# Patient Record
Sex: Female | Born: 1995 | Race: Black or African American | Hispanic: No | Marital: Single | Smoking: Never smoker
Health system: Southern US, Community
[De-identification: ages and names within clinical notes are randomized; demographics above are authoritative.]

## PROBLEM LIST (undated history)

## (undated) DIAGNOSIS — F410 Panic disorder [episodic paroxysmal anxiety] without agoraphobia: Secondary | ICD-10-CM

## (undated) DIAGNOSIS — F32A Depression, unspecified: Secondary | ICD-10-CM

## (undated) DIAGNOSIS — J45909 Unspecified asthma, uncomplicated: Secondary | ICD-10-CM

## (undated) DIAGNOSIS — F419 Anxiety disorder, unspecified: Secondary | ICD-10-CM

## (undated) DIAGNOSIS — F329 Major depressive disorder, single episode, unspecified: Secondary | ICD-10-CM

## (undated) HISTORY — PX: HERNIA REPAIR: SHX51

---

## 2016-02-06 ENCOUNTER — Encounter (HOSPITAL_COMMUNITY): Payer: Self-pay | Admitting: Emergency Medicine

## 2016-02-06 ENCOUNTER — Emergency Department (HOSPITAL_COMMUNITY): Payer: 59

## 2016-02-06 ENCOUNTER — Emergency Department (HOSPITAL_COMMUNITY)
Admission: EM | Admit: 2016-02-06 | Discharge: 2016-02-06 | Disposition: A | Discharge status: Home / Self Care | Payer: 59 | Attending: Emergency Medicine | Admitting: Emergency Medicine

## 2016-02-06 DIAGNOSIS — R079 Chest pain, unspecified: Secondary | ICD-10-CM | POA: Diagnosis present

## 2016-02-06 DIAGNOSIS — Z8659 Personal history of other mental and behavioral disorders: Secondary | ICD-10-CM | POA: Insufficient documentation

## 2016-02-06 DIAGNOSIS — R0789 Other chest pain: Secondary | ICD-10-CM | POA: Insufficient documentation

## 2016-02-06 DIAGNOSIS — J45909 Unspecified asthma, uncomplicated: Secondary | ICD-10-CM | POA: Diagnosis not present

## 2016-02-06 DIAGNOSIS — R42 Dizziness and giddiness: Secondary | ICD-10-CM | POA: Insufficient documentation

## 2016-02-06 DIAGNOSIS — Z3202 Encounter for pregnancy test, result negative: Secondary | ICD-10-CM | POA: Insufficient documentation

## 2016-02-06 DIAGNOSIS — R0602 Shortness of breath: Secondary | ICD-10-CM | POA: Diagnosis not present

## 2016-02-06 HISTORY — DX: Depression, unspecified: F32.A

## 2016-02-06 HISTORY — DX: Major depressive disorder, single episode, unspecified: F32.9

## 2016-02-06 HISTORY — DX: Panic disorder (episodic paroxysmal anxiety): F41.0

## 2016-02-06 HISTORY — DX: Unspecified asthma, uncomplicated: J45.909

## 2016-02-06 HISTORY — DX: Anxiety disorder, unspecified: F41.9

## 2016-02-06 LAB — CBC WITH DIFFERENTIAL/PLATELET
BASOS PCT: 1 %
Basophils Absolute: 0 10*3/uL (ref 0.0–0.1)
EOS ABS: 0.1 10*3/uL (ref 0.0–0.7)
EOS PCT: 1 %
HCT: 40.6 % (ref 36.0–46.0)
HEMOGLOBIN: 13.6 g/dL (ref 12.0–15.0)
Lymphocytes Relative: 43 %
Lymphs Abs: 2 10*3/uL (ref 0.7–4.0)
MCH: 30.6 pg (ref 26.0–34.0)
MCHC: 33.5 g/dL (ref 30.0–36.0)
MCV: 91.4 fL (ref 78.0–100.0)
MONOS PCT: 5 %
Monocytes Absolute: 0.2 10*3/uL (ref 0.1–1.0)
NEUTROS PCT: 50 %
Neutro Abs: 2.4 10*3/uL (ref 1.7–7.7)
Platelets: 246 10*3/uL (ref 150–400)
RBC: 4.44 MIL/uL (ref 3.87–5.11)
RDW: 14 % (ref 11.5–15.5)
WBC: 4.7 10*3/uL (ref 4.0–10.5)

## 2016-02-06 LAB — URINALYSIS, ROUTINE W REFLEX MICROSCOPIC
Bilirubin Urine: NEGATIVE
Glucose, UA: NEGATIVE mg/dL
Hgb urine dipstick: NEGATIVE
Ketones, ur: NEGATIVE mg/dL
Leukocytes, UA: NEGATIVE
NITRITE: NEGATIVE
Protein, ur: NEGATIVE mg/dL
SPECIFIC GRAVITY, URINE: 1.013 (ref 1.005–1.030)
pH: 6.5 (ref 5.0–8.0)

## 2016-02-06 LAB — BASIC METABOLIC PANEL
Anion gap: 6 (ref 5–15)
BUN: 10 mg/dL (ref 6–20)
CALCIUM: 9.1 mg/dL (ref 8.9–10.3)
CO2: 24 mmol/L (ref 22–32)
CREATININE: 0.88 mg/dL (ref 0.44–1.00)
Chloride: 109 mmol/L (ref 101–111)
GFR calc non Af Amer: >60 mL/min (ref >60)
Glucose, Bld: 98 mg/dL (ref 65–99)
Potassium: 4.3 mmol/L (ref 3.5–5.1)
SODIUM: 139 mmol/L (ref 135–145)

## 2016-02-06 LAB — TROPONIN I

## 2016-02-06 LAB — D-DIMER, QUANTITATIVE: D-Dimer, Quant: <0.27 ug/mL-FEU (ref 0.00–0.50)

## 2016-02-06 LAB — PREGNANCY, URINE: Preg Test, Ur: NEGATIVE

## 2016-02-06 MED ORDER — ACETAMINOPHEN 500 MG PO TABS
1000.0000 mg | ORAL_TABLET | Freq: Once | ORAL | Status: AC
  Administered 2016-02-06: 1000 mg via ORAL
  Filled 2016-02-06: qty 2

## 2016-02-06 MED ORDER — METHOCARBAMOL 500 MG PO TABS: 1000.0000 mg | ORAL_TABLET | Freq: Four times a day (QID) | ORAL | Status: AC | PRN

## 2016-02-06 NOTE — ED Notes (Signed)
Central tight chest pain starting last Thursday, states she did have a panic attack around then. C/o chest pain, SOB, and dizziness at this time, states the symptoms don't usually last over the course of days. Denies using birth control

## 2016-02-06 NOTE — ED Notes (Signed)
Family at bedside. 

## 2016-02-06 NOTE — ED Notes (Signed)
MD at bedside. 

## 2016-02-06 NOTE — Progress Notes (Signed)
[facility] ED CM noted pt with coverage but no pcp listed [facility] ED CM spoke with pt on how to obtain an in network pcp with insurance coverage via the customer service number or web site  Cm reviewed ED level of care for crisis/emergent services and community pcp level of care to manage continuous or chronic medical concerns.  The pt voiced understanding CM encouraged pt and discussed pt's responsibility to verify with pt's insurance carrier that any recommended medical provider offered by any emergency room or a hospital provider is within the carrier's network. The pt voiced understanding   

## 2016-02-06 NOTE — Discharge Instructions (Signed)
Take the prescription as directed. Also take over the counter tylenol, as directed on packaging, as needed for discomfort. Apply moist heat or ice to the area(s) of discomfort, for 15 minutes at a time, several times per day for the next few days.  Do not fall asleep on a heating or ice pack.  Call your regular medical doctor today to schedule a follow up appointment within the next 2 days. Call the mental health resources given to you todday regarding your anxiety.  Return to the Emergency Department immediately if worsening.

## 2016-02-06 NOTE — ED Provider Notes (Signed)
CSN: [id number]     Arrival date & time 02/06/16  [id number] History   First MD Initiated Contact with Patient 02/06/16 [phone]     Chief Complaint  Patient presents with  . Chest Pain      HPI  Pt was seen at 0945.  Per pt, c/o gradual onset and persistence of constant left sided chest wall "pain" that began 3 days ago. Has been associated with SOB and "dizziness." States her symptoms began "when I had a panic attack." Symptoms have been constant and unchanged since onset. States she has had these symptoms previously with a panic attack but "they don't usually last this long." Denies palpitations, no cough, no abd pain, no N/V/D, no fevers, no rash, no calf/LE pain or unilateral swelling.    Past Medical History  Diagnosis Date  . Asthma   . Anxiety   . Depressed   . Panic attack    Past Surgical History  Procedure Laterality Date  . Hernia repair      Social History  Substance Use Topics  . Smoking status: None  . Smokeless tobacco: None  . Alcohol Use: None    Review of Systems ROS: Statement: All systems negative except as marked or noted in the HPI; Constitutional: Negative for fever and chills. ; ; Eyes: Negative for eye pain, redness and discharge. ; ; ENMT: Negative for ear pain, hoarseness, nasal congestion, sinus pressure and sore throat. ; ; Cardiovascular: +CP, SOB. Negative for palpitations, diaphoresis, and peripheral edema. ; ; Respiratory: Negative for cough, wheezing and stridor. ; ; Gastrointestinal: Negative for nausea, vomiting, diarrhea, abdominal pain, blood in stool, hematemesis, jaundice and rectal bleeding. . ; ; Genitourinary: Negative for dysuria, flank pain and hematuria. ; ; Musculoskeletal: Negative for back pain and neck pain. Negative for swelling and trauma.; ; Skin: Negative for pruritus, rash, abrasions, blisters, bruising and skin lesion.; ; Neuro: +"dizziness." Negative for headache and neck stiffness. Negative for weakness, altered level of consciousness  , altered mental status, extremity weakness, paresthesias, involuntary movement, seizure and syncope.      Allergies  Ibuprofen; Kiwi extract; Chicken allergy; Pork-derived products; and Strawberry flavor  Home Medications   Prior to Admission medications   Not on File   BP 110/73 mmHg  Pulse 107  Temp(Src) 97.8 F (36.6 C) (Oral)  Resp 14  SpO2 98% Physical Exam  0950: Physical examination:  Nursing notes reviewed; Vital signs and O2 SAT reviewed;  Constitutional: Well developed, Well nourished, Well hydrated, In no acute distress; Head:  Normocephalic, atraumatic; Eyes: EOMI, PERRL, No scleral icterus; ENMT: Mouth and pharynx normal, Mucous membranes moist; Neck: Supple, Full range of motion, No lymphadenopathy; Cardiovascular: Regular rate and rhythm, No murmur, rub, or gallop; Respiratory: Breath sounds clear & equal bilaterally, No rales, rhonchi, wheezes.  Speaking full sentences with ease, Normal respiratory effort/excursion; Chest: +left parasternal and anterior chest wall tender to palp. No rash, no deformity, no soft tissue crepitus. Movement normal; Abdomen: Soft, Nontender, Nondistended, Normal bowel sounds; Genitourinary: No CVA tenderness; Extremities: Pulses normal, No tenderness, No edema, No calf edema or asymmetry.; Neuro: AA&Ox3, Major CN grossly intact.  Speech clear. No gross focal motor or sensory deficits in extremities. Climbs on and off stretcher easily by herself. Gait steady.; Skin: Color normal, Warm, Dry.; Psych:  Anxious.     ED Course  Procedures (including critical care time) Labs Review  Imaging Review  I have personally reviewed and evaluated these images and lab results as part of  my medical decision-making.   EKG Interpretation   Date/Time:  Monday February 06 2016 09:29:47 EDT Ventricular Rate:  97 PR Interval:  159 QRS Duration: 83 QT Interval:  318 QTC Calculation: 404 R Axis:   77 Text Interpretation:  Sinus rhythm RSR' in V1 or V2, right  VCD or RVH No  old tracing to compare Confirmed by [facility]  MD, Nicholos Johns [phone]) on  02/06/2016 9:50:33 AM      MDM  MDM Reviewed: previous chart, nursing note and vitals Interpretation: labs, ECG and x-ray      Results for orders placed or performed during the hospital encounter of 02/06/16  Basic metabolic panel  Result Value Ref Range   Sodium 139 135 - 145 mmol/L   Potassium 4.3 3.5 - 5.1 mmol/L   Chloride 109 101 - 111 mmol/L   CO2 24 22 - 32 mmol/L   Glucose, Bld 98 65 - 99 mg/dL   BUN 10 6 - 20 mg/dL   Creatinine, Ser 6.04 0.44 - 1.00 mg/dL   Calcium 9.1 8.9 - 54.0 mg/dL   GFR calc non Af Amer >60 >60 mL/min   GFR calc Af Amer >60 >60 mL/min   Anion gap 6 5 - 15  CBC with Differential  Result Value Ref Range   WBC 4.7 4.0 - 10.5 K/uL   RBC 4.44 3.87 - 5.11 MIL/uL   Hemoglobin 13.6 12.0 - 15.0 g/dL   HCT 98.1 19.1 - 47.8 %   MCV 91.4 78.0 - 100.0 fL   MCH 30.6 26.0 - 34.0 pg   MCHC 33.5 30.0 - 36.0 g/dL   RDW 29.5 62.1 - 30.8 %   Platelets 246 150 - 400 K/uL   Neutrophils Relative % 50 %   Neutro Abs 2.4 1.7 - 7.7 K/uL   Lymphocytes Relative 43 %   Lymphs Abs 2.0 0.7 - 4.0 K/uL   Monocytes Relative 5 %   Monocytes Absolute 0.2 0.1 - 1.0 K/uL   Eosinophils Relative 1 %   Eosinophils Absolute 0.1 0.0 - 0.7 K/uL   Basophils Relative 1 %   Basophils Absolute 0.0 0.0 - 0.1 K/uL  D-dimer, quantitative  Result Value Ref Range   D-Dimer, Quant <0.27 0.00 - 0.50 ug/mL-FEU  Pregnancy, urine  Result Value Ref Range   Preg Test, Ur NEGATIVE NEGATIVE  Urinalysis, Routine w reflex microscopic  Result Value Ref Range   Color, Urine YELLOW YELLOW   APPearance CLEAR CLEAR   Specific Gravity, Urine 1.013 1.005 - 1.030   pH 6.5 5.0 - 8.0   Glucose, UA NEGATIVE NEGATIVE mg/dL   Hgb urine dipstick NEGATIVE NEGATIVE   Bilirubin Urine NEGATIVE NEGATIVE   Ketones, ur NEGATIVE NEGATIVE mg/dL   Protein, ur NEGATIVE NEGATIVE mg/dL   Nitrite NEGATIVE NEGATIVE   Leukocytes,  UA NEGATIVE NEGATIVE  Troponin I  Result Value Ref Range   Troponin I <0.03 <0.031 ng/mL   Dg Chest 2 View 02/06/2016  CLINICAL DATA:  Mid chest pain, shortness of breath worsening over the last few days EXAM: CHEST  2 VIEW COMPARISON:  None. FINDINGS: The heart size and mediastinal contours are within normal limits. Both lungs are clear. The visualized skeletal structures are unremarkable. IMPRESSION: No active cardiopulmonary disease. Electronically Signed   By: Charlett Nose M.D.   On: 02/06/2016 11:26    1135:  Doubt PE as cause for symptoms with normal d-dimer and low risk [place].  Doubt ACS as cause for symptoms with normal troponin and  unchanged EKG from previous after 3 days of constant symptoms. Tx symptomatically, f/u PMD. Dx and testing d/w pt and family.  Questions answered.  Verb understanding, agreeable to d/c home with outpt f/u.    Samuel Jester, DO 02/10/16 0800

## 2016-03-27 ENCOUNTER — Emergency Department (HOSPITAL_COMMUNITY): Payer: 59

## 2016-03-27 ENCOUNTER — Emergency Department (HOSPITAL_COMMUNITY)
Admission: EM | Admit: 2016-03-27 | Discharge: 2016-03-27 | Disposition: A | Discharge status: Home / Self Care | Payer: 59 | Attending: Emergency Medicine | Admitting: Emergency Medicine

## 2016-03-27 ENCOUNTER — Encounter (HOSPITAL_COMMUNITY): Payer: Self-pay

## 2016-03-27 DIAGNOSIS — R55 Syncope and collapse: Secondary | ICD-10-CM | POA: Diagnosis not present

## 2016-03-27 DIAGNOSIS — R102 Pelvic and perineal pain: Secondary | ICD-10-CM | POA: Diagnosis not present

## 2016-03-27 DIAGNOSIS — J45909 Unspecified asthma, uncomplicated: Secondary | ICD-10-CM | POA: Insufficient documentation

## 2016-03-27 DIAGNOSIS — R1032 Left lower quadrant pain: Secondary | ICD-10-CM

## 2016-03-27 DIAGNOSIS — Z79899 Other long term (current) drug therapy: Secondary | ICD-10-CM | POA: Insufficient documentation

## 2016-03-27 LAB — URINALYSIS, ROUTINE W REFLEX MICROSCOPIC
Bilirubin Urine: NEGATIVE
Glucose, UA: NEGATIVE mg/dL
HGB URINE DIPSTICK: NEGATIVE
KETONES UR: NEGATIVE mg/dL
LEUKOCYTES UA: NEGATIVE
Nitrite: NEGATIVE
PROTEIN: NEGATIVE mg/dL
Specific Gravity, Urine: 1.028 (ref 1.005–1.030)
pH: 6.5 (ref 5.0–8.0)

## 2016-03-27 LAB — BASIC METABOLIC PANEL
Anion gap: 7 (ref 5–15)
BUN: 11 mg/dL (ref 6–20)
CALCIUM: 9.2 mg/dL (ref 8.9–10.3)
CO2: 22 mmol/L (ref 22–32)
CREATININE: 0.64 mg/dL (ref 0.44–1.00)
Chloride: 107 mmol/L (ref 101–111)
GFR calc non Af Amer: >60 mL/min (ref >60)
Glucose, Bld: 94 mg/dL (ref 65–99)
Potassium: 3.9 mmol/L (ref 3.5–5.1)
SODIUM: 136 mmol/L (ref 135–145)

## 2016-03-27 LAB — CBC
HCT: 41.3 % (ref 36.0–46.0)
Hemoglobin: 13.9 g/dL (ref 12.0–15.0)
MCH: 30.4 pg (ref 26.0–34.0)
MCHC: 33.7 g/dL (ref 30.0–36.0)
MCV: 90.4 fL (ref 78.0–100.0)
PLATELETS: 290 10*3/uL (ref 150–400)
RBC: 4.57 MIL/uL (ref 3.87–5.11)
RDW: 13.2 % (ref 11.5–15.5)
WBC: 6.6 10*3/uL (ref 4.0–10.5)

## 2016-03-27 LAB — HCG, QUANTITATIVE, PREGNANCY: hCG, Beta Chain, Quant, S: <1 m[IU]/mL (ref <5)

## 2016-03-27 LAB — WET PREP, GENITAL
Clue Cells Wet Prep HPF POC: NONE SEEN
SPERM: NONE SEEN
TRICH WET PREP: NONE SEEN
Yeast Wet Prep HPF POC: NONE SEEN

## 2016-03-27 LAB — CBG MONITORING, ED: GLUCOSE-CAPILLARY: 79 mg/dL (ref 65–99)

## 2016-03-27 MED ORDER — TRAMADOL HCL 50 MG PO TABS: 100.0000 mg | ORAL_TABLET | Freq: Four times a day (QID) | ORAL | Status: AC | PRN

## 2016-03-27 MED ORDER — ONDANSETRON HCL 4 MG/2ML IJ SOLN
4.0000 mg | Freq: Once | INTRAMUSCULAR | Status: AC | PRN
  Administered 2016-03-27: 4 mg via INTRAVENOUS
  Filled 2016-03-27: qty 2

## 2016-03-27 MED ORDER — POLYETHYLENE GLYCOL 3350 17 G PO PACK: 17.0000 g | PACK | Freq: Every day | ORAL | Status: AC

## 2016-03-27 MED ORDER — NAPROXEN SODIUM 220 MG PO TABS: 220.0000 mg | ORAL_TABLET | Freq: Two times a day (BID) | ORAL | Status: AC

## 2016-03-27 MED ORDER — MORPHINE SULFATE (PF) 4 MG/ML IV SOLN
4.0000 mg | Freq: Once | INTRAVENOUS | Status: AC
  Administered 2016-03-27: 4 mg via INTRAVENOUS
  Filled 2016-03-27: qty 1

## 2016-03-27 NOTE — ED Notes (Signed)
Pelvic setup at bedside.

## 2016-03-27 NOTE — ED Notes (Signed)
Pt states dizziness, nausea, vomiting x few days.  No fever.  Abdominal pain with frequent urination.  Pt states she is passing out and passed out x 2 today.  1st fall was caught by friend.  Second hit, Pt states she hit her hip and shoulder but not her head.

## 2016-03-27 NOTE — Progress Notes (Signed)
Patient listed as having [employer] insurance without a pcp.  EDCM spoke to patient at bedside.  Patient reports she [facility] but is not sure if he is her pcp.  EDCM provided patient with list of pcps who accept patient's insurance within a 20 mile radius of patient's zip code.  [facility].  Patient thankful for resources.  No further EDCM needs at this time.

## 2016-03-27 NOTE — ED Notes (Signed)
MD at bedside for pelvic

## 2016-03-27 NOTE — ED Notes (Signed)
US at bedside

## 2016-03-27 NOTE — ED Notes (Signed)
Pt instructed to change for pelvic

## 2016-03-27 NOTE — Discharge Instructions (Signed)
Pelvic Pain, Female Female pelvic pain can be caused by many different things and start from a variety of places. Pelvic pain refers to pain that is located in the lower half of the abdomen and between your hips. The pain may occur over a short period of time (acute) or may be reoccurring (chronic). The cause of pelvic pain may be related to disorders affecting the female reproductive organs (gynecologic), but it may also be related to the bladder, kidney stones, an intestinal complication, or muscle or skeletal problems. Getting help right away for pelvic pain is important, especially if there has been severe, sharp, or a sudden onset of unusual pain. It is also important to get help right away because some types of pelvic pain can be life threatening.  CAUSES  Below are only some of the causes of pelvic pain. The causes of pelvic pain can be in one of several categories.   Gynecologic.  Pelvic inflammatory disease.  Sexually transmitted infection.  Ovarian cyst or a twisted ovarian ligament (ovarian torsion).  Uterine lining that grows outside the uterus (endometriosis).  Fibroids, cysts, or tumors.  Ovulation.  Pregnancy.  Pregnancy that occurs outside the uterus (ectopic pregnancy).  Miscarriage.  Labor.  Abruption of the placenta or ruptured uterus.  Infection.  Uterine infection (endometritis).  Bladder infection.  Diverticulitis.  Miscarriage related to a uterine infection (septic abortion).  Bladder.  Inflammation of the bladder (cystitis).  Kidney stone(s).  Gastrointestinal.  Constipation.  Diverticulitis.  Neurologic.  Trauma.  Feeling pelvic pain because of mental or emotional causes (psychosomatic).  Cancers of the bowel or pelvis. EVALUATION  Your caregiver will want to take a careful history of your concerns. This includes recent changes in your health, a careful gynecologic history of your periods (menses), and a sexual history. Obtaining  your family history and medical history is also important. Your caregiver may suggest a pelvic exam. A pelvic exam will help identify the location and severity of the pain. It also helps in the evaluation of which organ system may be involved. In order to identify the cause of the pelvic pain and be properly treated, your caregiver may order tests. These tests may include:   A pregnancy test.  Pelvic ultrasonography.  An X-ray exam of the abdomen.  A urinalysis or evaluation of vaginal discharge.  Blood tests. HOME CARE INSTRUCTIONS   Only take over-the-counter or prescription medicines for pain, discomfort, or fever as directed by your caregiver.   Rest as directed by your caregiver.   Eat a balanced diet.   Drink enough fluids to make your urine clear or pale yellow, or as directed.   Avoid sexual intercourse if it causes pain.   Apply warm or cold compresses to the lower abdomen depending on which one helps the pain.   Avoid stressful situations.   Keep a journal of your pelvic pain. Write down when it started, where the pain is located, and if there are things that seem to be associated with the pain, such as food or your menstrual cycle.  Follow up with your caregiver as directed.  SEEK MEDICAL CARE IF:  Your medicine does not help your pain.  You have abnormal vaginal discharge. SEEK IMMEDIATE MEDICAL CARE IF:   You have heavy bleeding from the vagina.   Your pelvic pain increases.   You feel light-headed or faint.   You have chills.   You have pain with urination or blood in your urine.   You have uncontrolled  diarrhea or vomiting.   You have a fever or persistent symptoms for more than 3 days.  You have a fever and your symptoms suddenly get worse.   You are being physically or sexually abused.   This information is not intended to replace advice given to you by your health care provider. Make sure you discuss any questions you have with  your health care provider.   Document Released: 09/11/2004 Document Revised: 07/06/2015 Document Reviewed: 02/04/2012 Elsevier Interactive Patient Education 2016 Elsevier Inc. Suspected Mittelschmerz Mittelschmerz is pain in the lower abdomen that is felt between periods.  The pain affects one side of the abdomen. The side that it affects may change from month to month.  The pain may be mild or severe.  The pain may last from minutes to hours. It does not last longer than 1-2 days.  The pain can happen with nausea and light vaginal bleeding. Mittelschmerz is common among women. It is caused by the growth and release of an egg from an ovary, and it is a natural part of the ovulation cycle. It often happens about two weeks after a woman's period ends. HOME CARE INSTRUCTIONS Pay attention to any changes in your condition. Take these actions to help with your pain:  Try soaking in a hot bath.  Take over-the-counter and prescription medicines only as told by your health care provider.  Keep all follow-up visits as told by your health care provider. This is important. SEEK MEDICAL CARE IF:  You have very bad pain most months.  You have abdominal pain that lasts longer than 24 hours.  Your pain medicine is not helping.  You have a fever.  You have nausea or vomiting that will not go away.  You miss your period.  You have vaginal bleeding between your periods that is heavier than spotting.   This information is not intended to replace advice given to you by your health care provider. Make sure you discuss any questions you have with your health care provider.   Document Released: 10/05/2002 Document Revised: 07/06/2015 Document Reviewed: 01/10/2015 Elsevier Interactive Patient Education [employer]2016 Elsevier Inc.

## 2016-03-27 NOTE — ED Provider Notes (Signed)
CSN: [id number]     Arrival date & time 03/27/16  1540 History   First MD Initiated Contact with Patient 03/27/16 1707     Chief Complaint  Patient presents with  . Abdominal Pain  . Dizziness     (Consider location/radiation/quality/duration/timing/severity/associated sxs/prior Treatment) HPI The patient has lower abdominal pain that started yesterday. She reports that it's low and somewhat more to the left. Pain is sharp and worse with movements. No abnormal vaginal bleeding. The patient's last menstrual cycle approximately 2 weeks ago. No vaginal discharge if she considers abnormal. No fevers or vomiting. There has been slight nausea. Patient reports she's had constipation. She reports urinary frequency but no pain burning or urgency. Today patient experienced 2 episodes of lightheadedness and near-syncope with standing. The patient had a companion with her and did not fall or sustain injury. One episode occurred with standing and walking in her home and another episode with standing after sitting in the car. No associated headache or chest pain. Past Medical History  Diagnosis Date  . Asthma   . Anxiety   . Depressed   . Panic attack    Past Surgical History  Procedure Laterality Date  . Hernia repair     History reviewed. No pertinent family history. Social History  Substance Use Topics  . Smoking status: Never Smoker   . Smokeless tobacco: None  . Alcohol Use: No   OB History    No data available     Review of Systems 10 Systems reviewed and are negative for acute change except as noted in the HPI.    Allergies  Ibuprofen; Kiwi extract; Apple; Chicken allergy; Pork-derived products; and Strawberry flavor  Home Medications   Prior to Admission medications   Medication Sig Start Date End Date Taking? Authorizing Provider  acetaminophen (TYLENOL) 500 MG tablet Take 500 mg by mouth every 6 (six) hours as needed for moderate pain or headache.   Yes Historical Provider,  MD  cetirizine (ZYRTEC) 10 MG tablet Take 10 mg by mouth daily as needed for allergies.   Yes Historical Provider, MD  LATUDA 40 MG TABS tablet Take 40 mg by mouth at bedtime. 01/02/16  Yes Historical Provider, MD  PROAIR HFA 108 (90 Base) MCG/ACT inhaler Inhale 1-2 puffs into the lungs every 6 (six) hours as needed for wheezing or shortness of breath.  02/07/16  Yes Historical Provider, MD  methocarbamol (ROBAXIN) 500 MG tablet Take 2 tablets (1,000 mg total) by mouth 4 (four) times daily as needed for muscle spasms (muscle spasm/pain). Patient not taking: Reported on 03/27/2016 02/06/16   Samuel JesterKathleen McManus, DO  naproxen sodium (ALEVE) 220 MG tablet Take 1 tablet (220 mg total) by mouth 2 (two) times daily with a meal. 03/27/16   Arby BarretteMarcy Maks Cavallero, MD  polyethylene glycol (MIRALAX / GLYCOLAX) packet Take 17 g by mouth daily. 03/27/16   Arby BarretteMarcy Lexis Potenza, MD  traMADol (ULTRAM) 50 MG tablet Take 2 tablets (100 mg total) by mouth every 6 (six) hours as needed for moderate pain. 1-2 tablets every 6 hours as needed for pain 03/27/16   Arby BarretteMarcy Keionte Swicegood, MD   BP 110/75 mmHg  Pulse 92  Temp(Src) 98.2 F (36.8 C) (Oral)  Resp 16  SpO2 97%  LMP 03/13/2016 Physical Exam  Constitutional: She is oriented to person, place, and time. She appears well-developed and well-nourished.  HENT:  Head: Normocephalic and atraumatic.  Eyes: EOM are normal. Pupils are equal, round, and reactive to light.  Neck: Neck supple.  Cardiovascular: Normal rate, regular rhythm, normal heart sounds and intact distal pulses.   Pulmonary/Chest: Effort normal and breath sounds normal.  Abdominal: Soft. Bowel sounds are normal. She exhibits no distension. There is tenderness.  Pain to palpation suprapubic area and left lower quadrant. No guarding or rebound. No palpable mass. Upper and mid abdomen is nontender to palpation. No CVA tenderness.  Genitourinary:  Normal external female genitalia. Speculum examination cervix normal without discharge  or friability. Bimanual mild diffuse tenderness of uterus and bilateral adnexa.  Musculoskeletal: Normal range of motion. She exhibits no edema or tenderness.  Neurological: She is alert and oriented to person, place, and time. She has normal strength. Coordination normal. GCS eye subscore is 4. GCS verbal subscore is 5. GCS motor subscore is 6.  Skin: Skin is warm, dry and intact.  Psychiatric: She has a normal mood and affect.    ED Course  Procedures (including critical care time) Labs Review Labs Reviewed  WET PREP, GENITAL  BASIC METABOLIC PANEL  CBC  URINALYSIS, ROUTINE W REFLEX MICROSCOPIC (NOT AT [facility])  HCG, QUANTITATIVE, PREGNANCY  RPR  HIV ANTIBODY (ROUTINE TESTING)  CBG MONITORING, ED  GC/CHLAMYDIA PROBE AMP ([place]) NOT AT [facility]    Imaging Review US Transvaginal Non-ob  03/27/2016  CLINICAL DATA:  Left lower quadrant pain x2 days EXAM: TRANSABDOMINAL AND TRANSVAGINAL ULTRASOUND OF PELVIS TECHNIQUE: Both transabdominal and transvaginal ultrasound examinations of the pelvis were performed. Transabdominal technique was performed for global imaging of the pelvis including uterus, ovaries, adnexal regions, and pelvic cul-de-sac. It was necessary to proceed with endovaginal exam following the transabdominal exam to visualize the endometrium. COMPARISON:  None FINDINGS: Uterus Measurements: 6.9 x 3.4 x 3.9 cm. No fibroids or other mass visualized. Endometrium Thickness: 11 mm.  No focal abnormality visualized. Right ovary Measurements: 2.5 x 2.1 x 2.7 cm. Small peripheral follicles. Left ovary Measurements: 2.0 x 1.4 x 2.1 cm. Multiple small follicles. Other findings Small volume pelvic ascites. IMPRESSION: Negative pelvic ultrasound. Electronically Signed   By: Charline Bills M.D.   On: 03/27/2016 20:58   US Pelvis Complete  03/27/2016  CLINICAL DATA:  Left lower quadrant pain x2 days EXAM: TRANSABDOMINAL AND TRANSVAGINAL ULTRASOUND OF PELVIS TECHNIQUE: Both transabdominal and  transvaginal ultrasound examinations of the pelvis were performed. Transabdominal technique was performed for global imaging of the pelvis including uterus, ovaries, adnexal regions, and pelvic cul-de-sac. It was necessary to proceed with endovaginal exam following the transabdominal exam to visualize the endometrium. COMPARISON:  None FINDINGS: Uterus Measurements: 6.9 x 3.4 x 3.9 cm. No fibroids or other mass visualized. Endometrium Thickness: 11 mm.  No focal abnormality visualized. Right ovary Measurements: 2.5 x 2.1 x 2.7 cm. Small peripheral follicles. Left ovary Measurements: 2.0 x 1.4 x 2.1 cm. Multiple small follicles. Other findings Small volume pelvic ascites. IMPRESSION: Negative pelvic ultrasound. Electronically Signed   By: Charline Bills M.D.   On: 03/27/2016 20:58   I have personally reviewed and evaluated these images and lab results as part of my medical decision-making.   EKG Interpretation   Date/Time:  Tuesday Mar 27 2016 16:09:10 EDT Ventricular Rate:  100 PR Interval:  145 QRS Duration: 78 QT Interval:  313 QTC Calculation: 404 R Axis:   80 Text Interpretation:  Sinus tachycardia Right atrial enlargement Since  last tracing rate faster Confirmed by Effie Shy  MD, Mechele Collin ([id number]) on  03/27/2016 4:13:54 PM      MDM   Final diagnoses:  Pelvic pain in female  Near syncope   Patient had pelvic pain for approximately 2 days. Diagnostic workup is within normal limits. Ultrasound shows no pelvic anomaly and bimanual examination is not suggestive of PID. Do suspect mid menstrual cycle pain. She has nonsurgical abdomen and I doubt other surgical etiology such as appendicitis or other type of colitis. Pregnancy, torsion and UTI are ruled out. At this point feel the patient is safe for ongoing treatment for pain with ibuprofen and tramadol as needed. He had 2 episodes of near syncope with lightheadedness today. This sounds vasovagal and somewhat orthostatic in nature. Symptoms  occurred with transitioning from a sitting to standing or walking. Patient does not appear to have cardiopulmonary risk factors or associated symptoms that would be suggestive of significant cardiopulmonary etiology.    Arby Barrette, MD 03/27/16 2228

## 2016-03-28 LAB — RPR: RPR: NONREACTIVE

## 2016-03-28 LAB — GC/CHLAMYDIA PROBE AMP (~~LOC~~) NOT AT ARMC
CHLAMYDIA, DNA PROBE: NEGATIVE
NEISSERIA GONORRHEA: NEGATIVE

## 2016-03-28 LAB — HIV ANTIBODY (ROUTINE TESTING W REFLEX): HIV SCREEN 4TH GENERATION: NONREACTIVE

## 2017-09-26 IMAGING — US US TRANSVAGINAL NON-OB
1 series · 14 of 25 positions shown · non-contrast
Comparison: None

CLINICAL DATA: Left lower quadrant pain x2 days



[Series 1: us transvaginal non-ob · 0.21mm/px · 107 acquisitions, 14 frames shown]
[im 1/107]
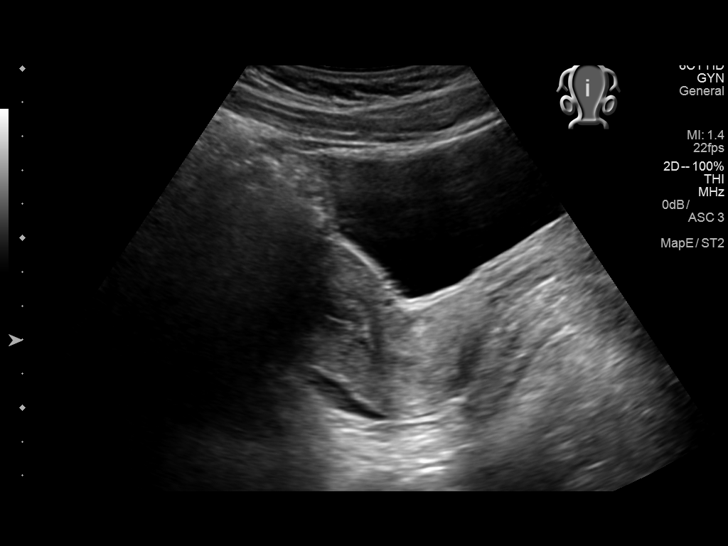
[im 9/107]
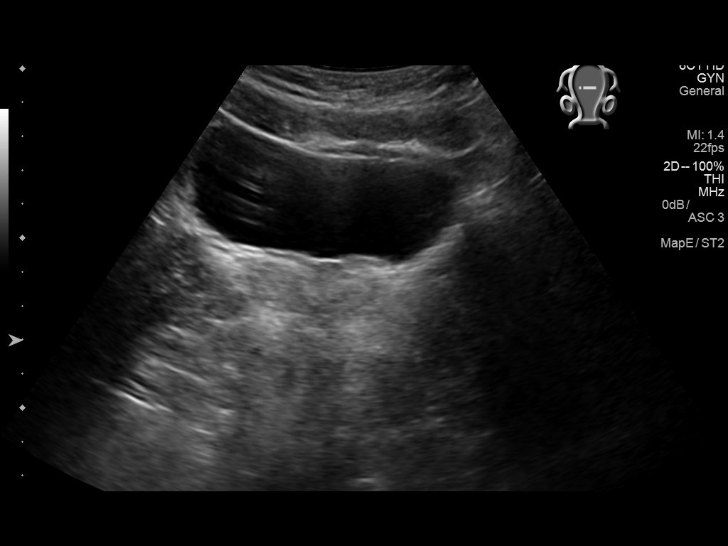
[im 18/107]
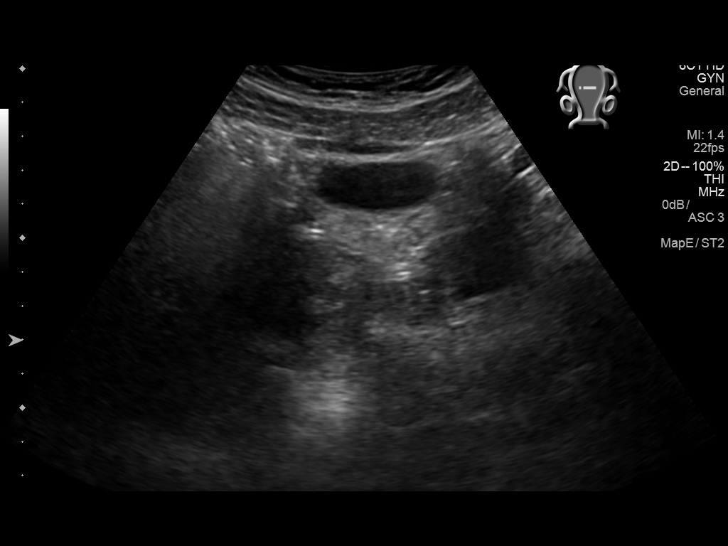
[im 27/107]
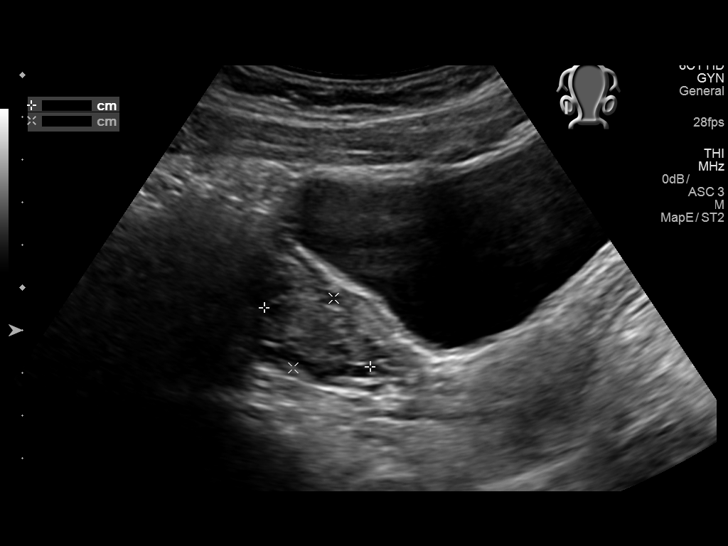
[im 36/107]
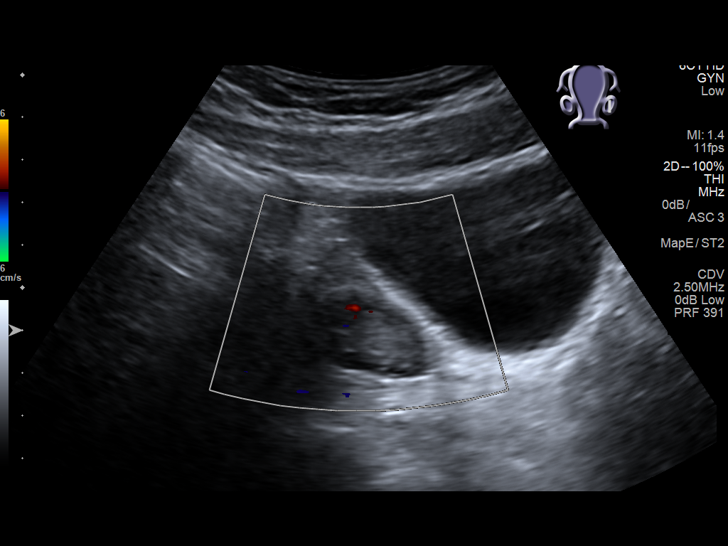
[im 40/107]
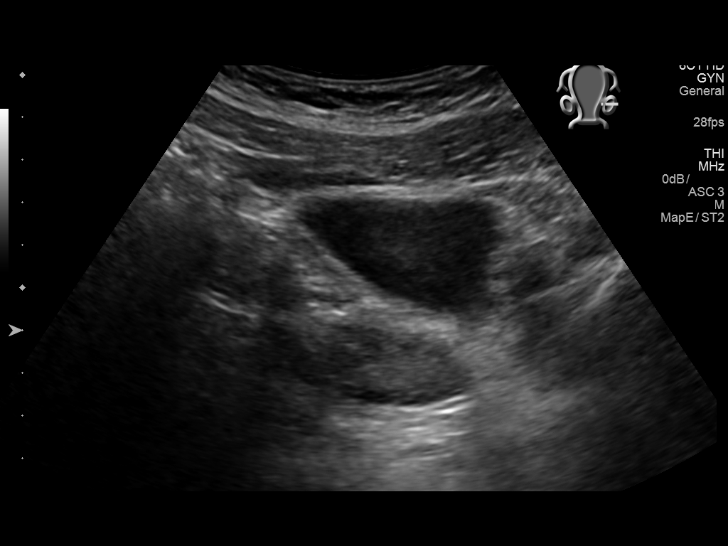
[im 49/107]
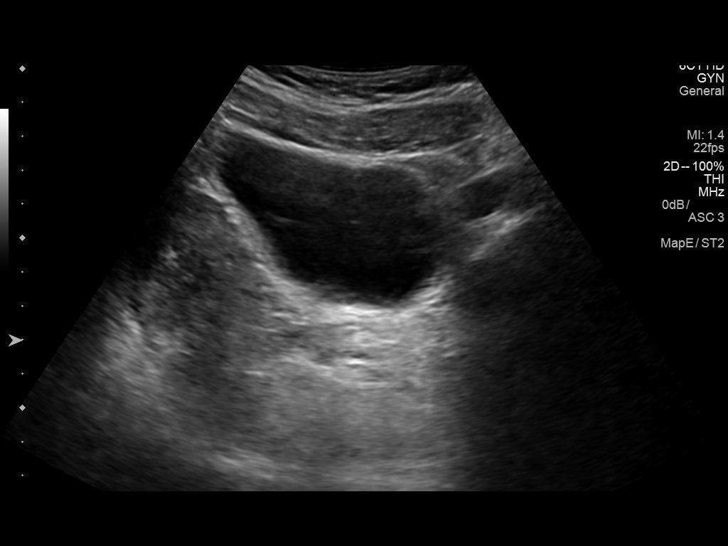
[im 58/107]
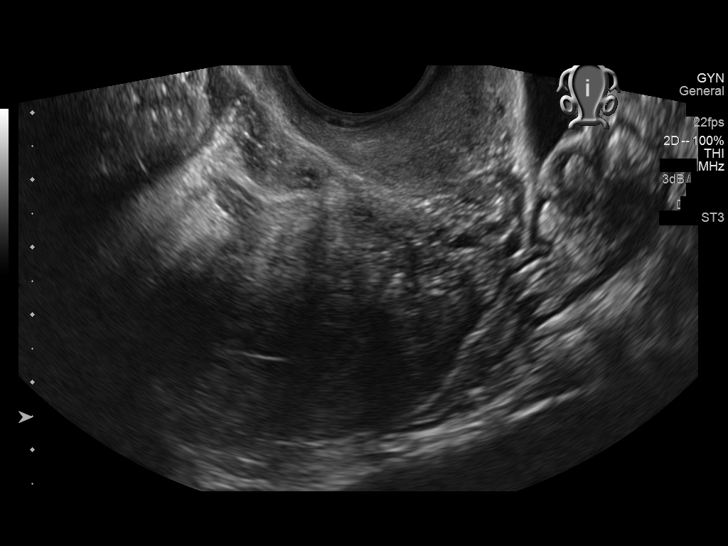
[im 67/107]
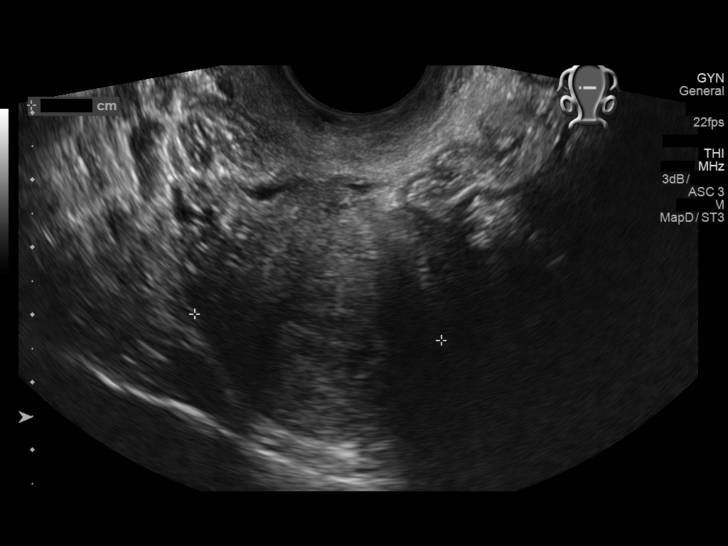
[im 71/107]
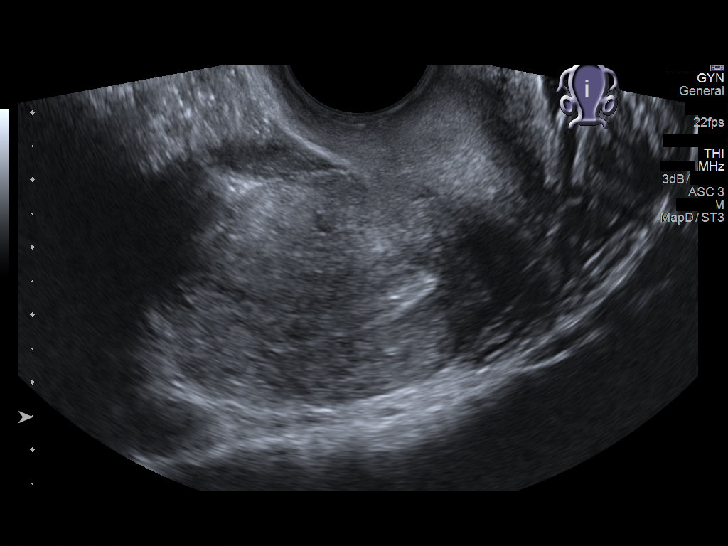
[im 80/107]
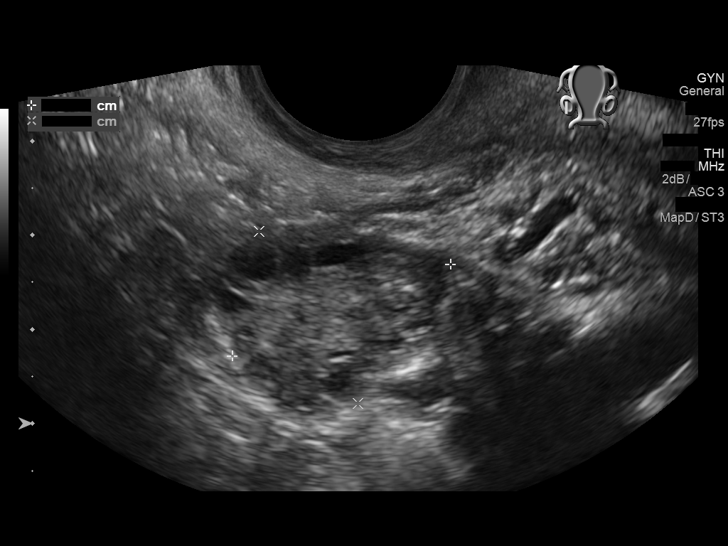
[im 89/107]
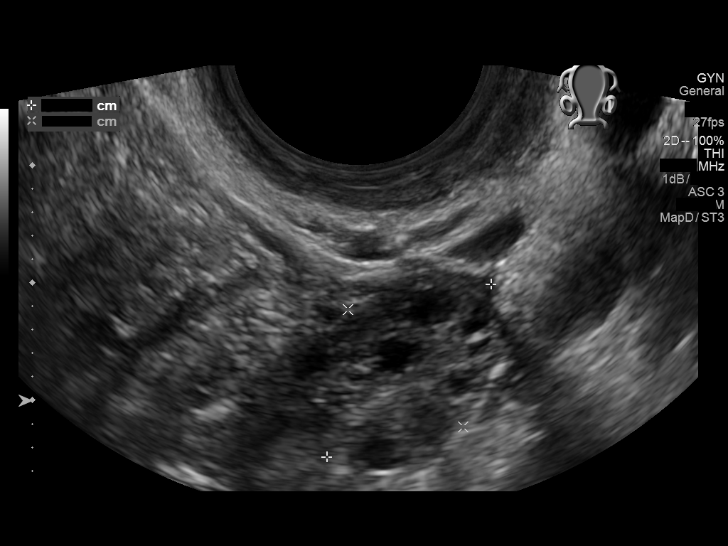
[im 98/107]
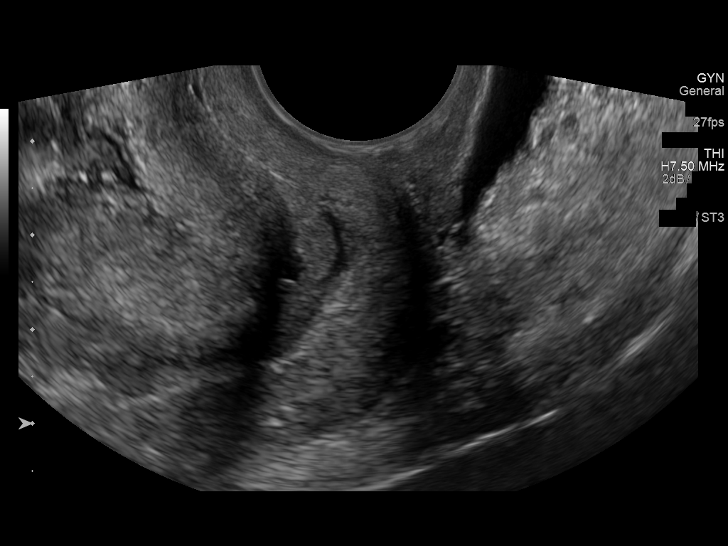
[im 107/107]
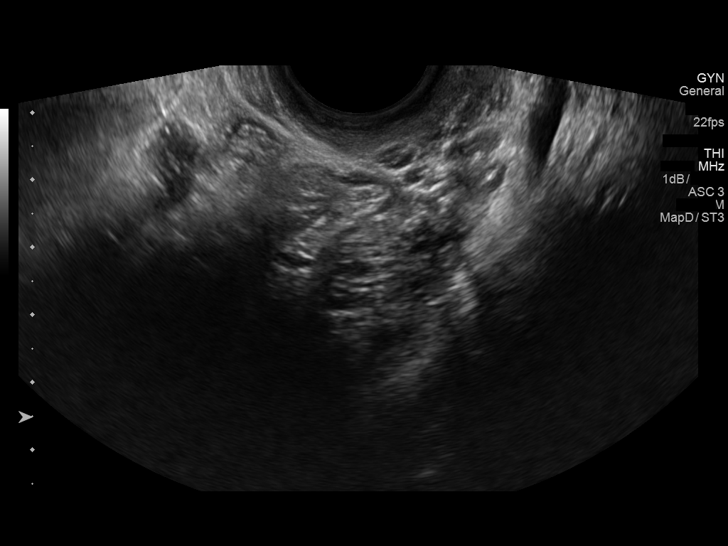

[14 of 25 positions shown; findings below may reference images not displayed]

FINDINGS: Uterus

Measurements: 6.9 x 3.4 x 3.9 cm. No fibroids or other mass
visualized.

Endometrium

Thickness: 11 mm.  No focal abnormality visualized.

Right ovary

Measurements: 2.5 x 2.1 x 2.7 cm. Small peripheral follicles.

Left ovary

Measurements: 2.0 x 1.4 x 2.1 cm. Multiple small follicles.

Other findings

Small volume pelvic ascites.
IMPRESSION: Negative pelvic ultrasound.
